# Patient Record
Sex: Male | Born: 1980 | Race: Black or African American | Hispanic: No | Smoking: Current some day smoker
Health system: Southern US, Community
[De-identification: ages and names within clinical notes are randomized; demographics above are authoritative.]

## PROBLEM LIST (undated history)

## (undated) DIAGNOSIS — J45909 Unspecified asthma, uncomplicated: Secondary | ICD-10-CM

---

## 2014-08-07 ENCOUNTER — Emergency Department
Admit: 2014-08-07 | Disposition: A | Discharge status: Home / Self Care | Payer: Self-pay | Admitting: Emergency Medicine

## 2014-08-10 ENCOUNTER — Emergency Department: Admit: 2014-08-10 | Disposition: A | Discharge status: Home / Self Care | Payer: Self-pay | Admitting: Internal Medicine

## 2016-08-27 ENCOUNTER — Emergency Department
Admission: EM | Admit: 2016-08-27 | Discharge: 2016-08-27 | Disposition: A | Discharge status: Home / Self Care | Payer: Self-pay | Attending: Emergency Medicine | Admitting: Emergency Medicine

## 2016-08-27 DIAGNOSIS — F17200 Nicotine dependence, unspecified, uncomplicated: Secondary | ICD-10-CM | POA: Insufficient documentation

## 2016-08-27 DIAGNOSIS — L0231 Cutaneous abscess of buttock: Secondary | ICD-10-CM | POA: Insufficient documentation

## 2016-08-27 MED ORDER — LIDOCAINE HCL (PF) 1 % IJ SOLN
INTRAMUSCULAR | Status: AC
  Filled 2016-08-27: qty 5

## 2016-08-27 MED ORDER — ONDANSETRON 4 MG PO TBDP
4.0000 mg | ORAL_TABLET | Freq: Once | ORAL | Status: AC
  Administered 2016-08-27: 4 mg via ORAL
  Filled 2016-08-27: qty 1

## 2016-08-27 MED ORDER — LIDOCAINE HCL (PF) 1 % IJ SOLN
10.0000 mL | Freq: Once | INTRAMUSCULAR | Status: DC
  Filled 2016-08-27: qty 10

## 2016-08-27 MED ORDER — SULFAMETHOXAZOLE-TRIMETHOPRIM 800-160 MG PO TABS
1.0000 | ORAL_TABLET | Freq: Once | ORAL | Status: AC
  Administered 2016-08-27: 1 via ORAL
  Filled 2016-08-27: qty 1

## 2016-08-27 MED ORDER — HYDROCODONE-ACETAMINOPHEN 5-325 MG PO TABS: 1.0000 | ORAL_TABLET | Freq: Three times a day (TID) | ORAL | 0 refills | Status: DC | PRN

## 2016-08-27 MED ORDER — OXYCODONE-ACETAMINOPHEN 5-325 MG PO TABS
1.0000 | ORAL_TABLET | Freq: Once | ORAL | Status: AC
  Administered 2016-08-27: 1 via ORAL
  Filled 2016-08-27: qty 1

## 2016-08-27 MED ORDER — SULFAMETHOXAZOLE-TRIMETHOPRIM 800-160 MG PO TABS: 1.0000 | ORAL_TABLET | Freq: Two times a day (BID) | ORAL | 0 refills | Status: DC

## 2016-08-27 NOTE — Discharge Instructions (Signed)
Keep the area clean, dry, and covered. Apply warm compresses to promote healing. Take the antibiotic as directed and the pain medicine as needed. Return to the ED in 2-3 days for wound check and packing removal.

## 2016-08-27 NOTE — ED Provider Notes (Signed)
[facility] Emergency Department Provider Note ____________________________________________  Time seen: 1345  I have reviewed the triage vital signs and the nursing notes.  HISTORY  Chief Complaint  Abscess  HPI Daniel Leblanc is a 36 y.o. male presents to the ED for evaluation of a probable abscess to the right buttocks has been present since Friday. He describes the pain became increasingly unbearable after a long 12+ hour car drive over the weekend. He admits to having to use a pillow to sit on. He denies any previous history of abscess or boils. He denies any interim fevers, chills, sweats, or spontaneous drainage.  History reviewed. No pertinent past medical history.  There are no active problems to display for this patient.  History reviewed. No pertinent surgical history.  Prior to Admission medications   Medication Sig Start Date End Date Taking? Authorizing Provider  HYDROcodone-acetaminophen (NORCO) 5-325 MG tablet Take 1 tablet by mouth 3 (three) times daily as needed. 08/27/16   Emiyah Spraggins V Bacon Eretria Manternach, PA-C  sulfamethoxazole-trimethoprim (BACTRIM DS,SEPTRA DS) 800-160 MG tablet Take 1 tablet by mouth 2 (two) times daily. 08/27/16   Charlesetta Ivory Jhett Fretwell, PA-C   Allergies Patient has no allergy information on record.  History reviewed. No pertinent family history.  Social History Social History  Substance Use Topics  . Smoking status: Current Some Day Smoker  . Smokeless tobacco: Not on file  . Alcohol use No   Review of Systems  Constitutional: Negative for fever. Cardiovascular: Negative for chest pain. Respiratory: Negative for shortness of breath. Gastrointestinal: Negative for abdominal pain, vomiting and diarrhea. Skin: Negative for rash. Right buttock abscess.  Neurological: Negative for headaches, focal weakness or numbness. ____________________________________________  PHYSICAL EXAM:  VITAL SIGNS: ED Triage Vitals  Enc  Vitals Group     BP 08/27/16 1223 120/78     Pulse Rate 08/27/16 1223 100     Resp 08/27/16 1223 16     Temp 08/27/16 1223 97.6 F (36.4 C)     Temp Source 08/27/16 1223 Oral     SpO2 08/27/16 1223 96 %     Weight 08/27/16 1224 180 lb (81.6 kg)     Height 08/27/16 1224  (1.753 m)     Head Circumference --      Peak Flow --      Pain Score 08/27/16 1225 6     Pain Loc --      Pain Edu? --      Excl. in GC? --     Constitutional: Alert and oriented. Well appearing and in no distress. Head: Normocephalic and atraumatic. Cardiovascular: Normal rate, regular rhythm. Normal distal pulses. Respiratory: Normal respiratory effort.  Gastrointestinal: Soft and nontender. No distention. Musculoskeletal: Nontender with normal range of motion in all extremities.  Skin:  Skin is warm, dry and intact. No rash noted. Right buttocks with a large area of induration and cellulitis with a small central area of fluctuance within the gluteal cleft. No encroachment upon the sphincter on evaluation. ____________________________________________  PROCEDURES  Bactrim DS I PO Percocet 5-325 mg PO  INCISION AND DRAINAGE Performed by: Lissa Hoard Consent: Verbal consent obtained. Risks and benefits: risks, benefits and alternatives were discussed Type: abscess  Body area: right buttock   Anesthesia: local infiltration  Incision was made with a scalpel.  Local anesthetic: lidocaine 1% w/o epinephrine  Anesthetic total: 10 ml  Complexity: complex Blunt dissection to break up loculations  Drainage: purulent  Drainage amount: minimal  Packing material: 1/4 in iodoform gauze  Patient tolerance: Patient tolerated the procedure well with no immediate complications. ____________________________________________  INITIAL IMPRESSION / ASSESSMENT AND PLAN / ED COURSE  Patient with a right buttock abscess s/p incision and drainage. He is discharged with prescription for Norco and  Bactrim DS pain and infection, respectively. He will return to the ED in 2-3 days for packing removal.   ____________________________________________  FINAL CLINICAL IMPRESSION(S) / ED DIAGNOSES  Final diagnoses:  Abscess of buttock, right      Lissa Hoard, PA-C 08/27/16 1855    Nita Sickle, MD 08/28/16 [phone]

## 2016-08-27 NOTE — ED Triage Notes (Signed)
abcess to R buttock that pt states has grown since Friday/Saturday when first noticed. States painful to sit. States near buttcrack and goes toward thigh. Alert, oriented, ambulatory.

## 2016-08-30 ENCOUNTER — Emergency Department
Admission: EM | Admit: 2016-08-30 | Discharge: 2016-08-30 | Disposition: A | Discharge status: Home / Self Care | Payer: Self-pay | Attending: Emergency Medicine | Admitting: Emergency Medicine

## 2016-08-30 ENCOUNTER — Encounter: Payer: Self-pay | Admitting: Medical Oncology

## 2016-08-30 DIAGNOSIS — Z4800 Encounter for change or removal of nonsurgical wound dressing: Secondary | ICD-10-CM | POA: Insufficient documentation

## 2016-08-30 DIAGNOSIS — F17200 Nicotine dependence, unspecified, uncomplicated: Secondary | ICD-10-CM | POA: Insufficient documentation

## 2016-08-30 DIAGNOSIS — Z5189 Encounter for other specified aftercare: Secondary | ICD-10-CM

## 2016-08-30 NOTE — ED Triage Notes (Signed)
Here for recheck of wound on buttocks and packing removal.

## 2016-08-30 NOTE — ED Provider Notes (Signed)
[facility] Emergency Department Provider Note   ____________________________________________   First MD Initiated Contact with Patient 08/30/16 1012     (approximate)  I have reviewed the triage vital signs and the nursing notes.   HISTORY  Chief Complaint Wound Check    HPI Daniel Leblanc is a 36 y.o. male here for packing removal of an abscess that was opened3 days ago in the emergency room. He denies any continued problems. Currently rates his pain is 2/10.   History reviewed. No pertinent past medical history.  There are no active problems to display for this patient.   History reviewed. No pertinent surgical history.  Prior to Admission medications   Medication Sig Start Date End Date Taking? Authorizing Provider  HYDROcodone-acetaminophen (NORCO) 5-325 MG tablet Take 1 tablet by mouth 3 (three) times daily as needed. 08/27/16   Jenise V Bacon Menshew, PA-C  sulfamethoxazole-trimethoprim (BACTRIM DS,SEPTRA DS) 800-160 MG tablet Take 1 tablet by mouth 2 (two) times daily. 08/27/16   Jenise V Bacon Menshew, PA-C    Allergies Shellfish allergy  No family history on file.  Social History Social History  Substance Use Topics  . Smoking status: Current Some Day Smoker  . Smokeless tobacco: Not on file  . Alcohol use No    Review of Systems  Constitutional: No fever/chills Cardiovascular: Denies chest pain. Respiratory: Denies shortness of breath. Gastrointestinal: .  No nausea, no vomiting.  Musculoskeletal: Negative for back pain. Skin: Positive resolving abscess right buttocks. Neurological: Negative for headaches   ____________________________________________   PHYSICAL EXAM:  VITAL SIGNS: ED Triage Vitals  Enc Vitals Group     BP 08/30/16 0859 118/81     Pulse Rate 08/30/16 0859 84     Resp 08/30/16 0859 16     Temp 08/30/16 0859 98.5 F (36.9 C)     Temp Source 08/30/16 0859 Oral     SpO2 08/30/16 0859 98 %   Weight 08/30/16 0856 180 lb (81.6 kg)     Height 08/30/16 0856  (1.753 m)     Head Circumference --      Peak Flow --      Pain Score 08/30/16 0856 2     Pain Loc --      Pain Edu? --      Excl. in GC? --     Constitutional: Alert and oriented. Well appearing and in no acute distress. Eyes: Conjunctivae are normal. PERRL. EOMI. Head: Atraumatic. Neck: No stridor.   Cardiovascular: Normal rate, regular rhythm. Grossly normal heart sounds.  Good peripheral circulation. Respiratory: Normal respiratory effort.  No retractions. Lungs CTAB. Musculoskeletal: Moves upper and lower extremities without any difficulty. Normal gait was noted. Neurologic:  Normal speech and language. No gross focal neurologic deficits are appreciated. No gait instability. Skin:  Skin is warm, dry.  Right buttocks medial aspect healing abscess without extending erythema. Psychiatric: Mood and affect are normal. Speech and behavior are normal.  ____________________________________________   LABS (all labs ordered are listed, but only abnormal results are displayed)  Labs Reviewed - No data to display  PROCEDURES  Procedure(s) performed: packing removal  Procedures  Critical Care performed: No  ____________________________________________   INITIAL IMPRESSION / ASSESSMENT AND PLAN / ED COURSE  Pertinent labs & imaging results that were available during my care of the patient were reviewed by me and considered in my medical decision making (see chart for details).  Packing was removed without any complications. Dressing was placed and  patient is to continue taking antibiotics until completely finished. He'll follow-up with [facility] clinic if any continued problems.      ____________________________________________   FINAL CLINICAL IMPRESSION(S) / ED DIAGNOSES  Final diagnoses:  Wound check, abscess      NEW MEDICATIONS STARTED DURING THIS VISIT:  Discharge Medication List as of  08/30/2016 10:28 AM       Note:  This document was prepared using Dragon voice recognition software and may include unintentional dictation errors.    Tommi Rumps, PA-C 08/30/16 1148    Myrna Blazer, MD 08/30/16 1630

## 2016-08-30 NOTE — ED Notes (Signed)
See triage note  States he had area lanced couple of days ago  Here for packing removal

## 2016-08-30 NOTE — Discharge Instructions (Signed)
Continue warm soaks or soak in a tub of warm water. Continue your medications. Follow-up with [facility] clinic acute-care if any continued problems. Take ibuprofen or Tylenol while at work. Continue to change dressings as needed.

## 2017-06-22 ENCOUNTER — Emergency Department
Admission: EM | Admit: 2017-06-22 | Discharge: 2017-06-22 | Disposition: A | Discharge status: Home / Self Care | Payer: BLUE CROSS/BLUE SHIELD | Attending: Emergency Medicine | Admitting: Emergency Medicine

## 2017-06-22 ENCOUNTER — Other Ambulatory Visit: Payer: Self-pay

## 2017-06-22 DIAGNOSIS — M25561 Pain in right knee: Secondary | ICD-10-CM | POA: Insufficient documentation

## 2017-06-22 DIAGNOSIS — Z79899 Other long term (current) drug therapy: Secondary | ICD-10-CM | POA: Diagnosis not present

## 2017-06-22 DIAGNOSIS — F17200 Nicotine dependence, unspecified, uncomplicated: Secondary | ICD-10-CM | POA: Insufficient documentation

## 2017-06-22 MED ORDER — KETOROLAC TROMETHAMINE 30 MG/ML IJ SOLN: 30.0000 mg | Freq: Once | INTRAMUSCULAR | Status: DC

## 2017-06-22 MED ORDER — KETOROLAC TROMETHAMINE 10 MG PO TABS: 10.0000 mg | ORAL_TABLET | Freq: Four times a day (QID) | ORAL | 0 refills | Status: DC | PRN

## 2017-06-22 MED ORDER — KETOROLAC TROMETHAMINE 30 MG/ML IJ SOLN
30.0000 mg | Freq: Once | INTRAMUSCULAR | Status: AC
  Administered 2017-06-22: 30 mg via INTRAMUSCULAR
  Filled 2017-06-22: qty 1

## 2017-06-22 NOTE — ED Notes (Signed)

## 2017-06-22 NOTE — ED Provider Notes (Signed)
[facility] Emergency Department Provider Note  ____________________________________________  Time seen: Approximately 1:24 PM  I have reviewed the triage vital signs and the nursing notes.   HISTORY  Chief Complaint Knee Pain    HPI Daniel Leblanc is a 37 y.o. male presents emergency department for evaluation of right knee pain for 2 days.  It feels like it is "in the joint." He states that it is not painful to touch but it is painful when he bears weight on it.  No swelling.  No trauma.  Patient states that he was in the [employer] and has had knee pain in the past from this.  No trauma.  No alleviating measures have been attempted.  No redness, warmth, numbness, tingling.    History reviewed. No pertinent past medical history.  There are no active problems to display for this patient.   History reviewed. No pertinent surgical history.  Prior to Admission medications   Medication Sig Start Date End Date Taking? Authorizing Provider  HYDROcodone-acetaminophen (NORCO) 5-325 MG tablet Take 1 tablet by mouth 3 (three) times daily as needed. 08/27/16   Menshew, Charlesetta Ivory, PA-C  ketorolac (TORADOL) 10 MG tablet Take 1 tablet (10 mg total) by mouth every 6 (six) hours as needed. 06/22/17   Enid Derry, PA-C  sulfamethoxazole-trimethoprim (BACTRIM DS,SEPTRA DS) 800-160 MG tablet Take 1 tablet by mouth 2 (two) times daily. 08/27/16   Menshew, Charlesetta Ivory, PA-C    Allergies Shellfish allergy  History reviewed. No pertinent family history.  Social History Social History   Tobacco Use  . Smoking status: Current Some Day Smoker  Substance Use Topics  . Alcohol use: No  . Drug use: Not on file     Review of Systems  Constitutional: No fever/chills Cardiovascular: No chest pain. Respiratory: No SOB. Gastrointestinal: No abdominal pain.  No nausea, no vomiting.   Musculoskeletal: Positive for knee pain. Skin: Negative for rash, abrasions,  lacerations, ecchymosis.   ____________________________________________   PHYSICAL EXAM:  VITAL SIGNS: ED Triage Vitals [06/22/17 1121]  Enc Vitals Group     BP 115/65     Pulse Rate (!) 108     Resp 18     Temp 98.3 F (36.8 C)     Temp Source Oral     SpO2 100 %     Weight 160 lb (72.6 kg)     Height 5\' 9"  (1.753 m)     Head Circumference      Peak Flow      Pain Score 5     Pain Loc      Pain Edu?      Excl. in GC?      Constitutional: Alert and oriented. Well appearing and in no acute distress. Eyes: Conjunctivae are normal. PERRL. EOMI. Head: Atraumatic. ENT:      Ears:      Nose: No congestion/rhinnorhea.      Mouth/Throat: Mucous membranes are moist.  Neck: No stridor.   Cardiovascular: Normal rate, regular rhythm.  Good peripheral circulation. Respiratory: Normal respiratory effort without tachypnea or retractions. Lungs CTAB. Good air entry to the bases with no decreased or absent breath sounds. Musculoskeletal: Full range of motion to all extremities. No gross deformities appreciated.  No warmth or erythema to knee.  No pain with range of motion of knee.  No tenderness to palpation. No effusion noted. Negative anterior drawer, posterior drawer, valgus, varus, mcMurray, patella apprehension. Positive apley grind.  Strength equal in lower  extremity's bilaterally. Neurologic:  Normal speech and language. No gross focal neurologic deficits are appreciated.  Skin:  Skin is warm, dry and intact. No rash noted.   ____________________________________________   LABS (all labs ordered are listed, but only abnormal results are displayed)  Labs Reviewed - No data to display ____________________________________________  EKG   ____________________________________________  RADIOLOGY   No results found.  ____________________________________________    PROCEDURES  Procedure(s) performed:    Procedures    Medications  ketorolac (TORADOL) 30 MG/ML  injection 30 mg (30 mg Intramuscular Given 06/22/17 1401)     ____________________________________________   INITIAL IMPRESSION / ASSESSMENT AND PLAN / ED COURSE  Pertinent labs & imaging results that were available during my care of the patient were reviewed by me and considered in my medical decision making (see chart for details).  Review of the [place] CSRS was performed in accordance of the [facility] prior to dispensing any controlled drugs.   Patient presented to the emergency department for evaluation of left knee pain for 2 days.  Vital signs and exam are reassuring.  We discussed a knee x-ray and patient is agreeable that this will unlikely show anything.  Patient would like to try conservative measures first.  Knee was ace wrapped and crutches were given.  IM Toradol was given.  Patient will be discharged home with prescriptions for Toradol. Patient is to follow up with PCP as directed. Patient is given ED precautions to return to the ED for any worsening or new symptoms.   ____________________________________________  FINAL CLINICAL IMPRESSION(S) / ED DIAGNOSES  Final diagnoses:  Acute pain of right knee      NEW MEDICATIONS STARTED DURING THIS VISIT:  ED Discharge Orders        Ordered    ketorolac (TORADOL) 10 MG tablet  Every 6 hours PRN     06/22/17 1346          This chart was dictated using voice recognition software/Dragon. Despite best efforts to proofread, errors can occur which can change the meaning. Any change was purely unintentional.    Enid DerryWagner, Traevon Meiring, PA-C 06/22/17 1543    Arnaldo NatalMalinda, Paul F, MD 06/23/17 [phone]

## 2017-06-22 NOTE — ED Triage Notes (Signed)
Pt arrives POV to ED c/o R knee pain. Denies injury or trauma. Pain x few days. Alert, oriented, ambulatory. hasn't taken anything at home.

## 2017-06-22 NOTE — ED Notes (Signed)
Pt reports that he is having R knee pain, but denies injury to R knee at this time.  Pt states that this started a few days ago.  R knee is slightly more swollen.

## 2017-07-07 ENCOUNTER — Encounter: Payer: Self-pay | Admitting: Emergency Medicine

## 2017-07-07 ENCOUNTER — Emergency Department
Admission: EM | Admit: 2017-07-07 | Discharge: 2017-07-07 | Disposition: A | Discharge status: Home / Self Care | Payer: BLUE CROSS/BLUE SHIELD | Attending: Emergency Medicine | Admitting: Emergency Medicine

## 2017-07-07 ENCOUNTER — Emergency Department: Payer: BLUE CROSS/BLUE SHIELD

## 2017-07-07 DIAGNOSIS — M25561 Pain in right knee: Secondary | ICD-10-CM | POA: Diagnosis not present

## 2017-07-07 DIAGNOSIS — F17200 Nicotine dependence, unspecified, uncomplicated: Secondary | ICD-10-CM | POA: Diagnosis not present

## 2017-07-07 DIAGNOSIS — Z79899 Other long term (current) drug therapy: Secondary | ICD-10-CM | POA: Diagnosis not present

## 2017-07-07 MED ORDER — MELOXICAM 15 MG PO TABS: 15.0000 mg | ORAL_TABLET | Freq: Every day | ORAL | 0 refills | Status: DC

## 2017-07-07 NOTE — ED Notes (Signed)
See triage note  Noticed pain and swelling to right knee w/o injury    States this has happened in the past

## 2017-07-07 NOTE — Discharge Instructions (Signed)
Please avoid any squatting or twisting on the right knee.  Take Mobic 15 mg daily with food.  Rest ice and elevate the knee.  Follow-up with orthopedics if no improvement 1 week.

## 2017-07-07 NOTE — ED Triage Notes (Signed)
Pt comes into the ED via POV c/o right knee pain and swelling.  Patient ambulatory to triage and denies any injury to the knee.

## 2017-07-07 NOTE — ED Provider Notes (Signed)
[facility] EMERGENCY DEPARTMENT Provider Note   CSN: [id number] Arrival date & time: 07/07/17  1247     History   Chief Complaint Chief Complaint  Patient presents with  . Knee Pain    HPI Daniel Leblanc is a 37 y.o. male presents to the emergency department for evaluation of localized knee pain to the right knee.  Pain is been present for several weeks.  No trauma or injury.  He denies any catching clicking or locking but does have intermittent swelling throughout the right knee.  Patient states he was in [employer] and did a lot of jumping in and out of vehicles.  He describes a aching throbbing pain along the medial joint line with no warmth redness or fevers.  No pain throughout the calf.  No edema throughout the lower leg.  He was prescribed Toradol p.o. last visit on 06/22/2017 and did not get this filled.  HPI  History reviewed. No pertinent past medical history.  There are no active problems to display for this patient.   History reviewed. No pertinent surgical history.     Home Medications    Prior to Admission medications   Medication Sig Start Date End Date Taking? Authorizing Provider  HYDROcodone-acetaminophen (NORCO) 5-325 MG tablet Take 1 tablet by mouth 3 (three) times daily as needed. 08/27/16   Menshew, Charlesetta Ivory, PA-C  meloxicam (MOBIC) 15 MG tablet Take 1 tablet (15 mg total) by mouth daily. 07/07/17 07/07/18  Evon Slack, PA-C  sulfamethoxazole-trimethoprim (BACTRIM DS,SEPTRA DS) 800-160 MG tablet Take 1 tablet by mouth 2 (two) times daily. 08/27/16   Menshew, Charlesetta Ivory, PA-C    Family History No family history on file.  Social History Social History   Tobacco Use  . Smoking status: Current Some Day Smoker  . Smokeless tobacco: Never Used  Substance Use Topics  . Alcohol use: No  . Drug use: Not on file     Allergies   Shellfish allergy   Review of Systems Review of Systems  Constitutional: Negative for  fever.  Musculoskeletal: Positive for arthralgias, gait problem and joint swelling. Negative for back pain and neck pain.  Skin: Negative for wound.  Neurological: Negative for numbness.     Physical Exam Updated Vital Signs BP 121/70 (BP Location: Left Arm)   Pulse 85   Temp 98.4 F (36.9 C) (Oral)   Resp 18   Ht 5\' 9"  (1.753 m)   Wt 72.6 kg (160 lb)   SpO2 97%   BMI 23.63 kg/m   Physical Exam  Constitutional: He is oriented to person, place, and time. He appears well-developed and well-nourished.  HENT:  Head: Normocephalic and atraumatic.  Eyes: Conjunctivae are normal.  Neck: Normal range of motion.  Cardiovascular: Normal rate.  Pulmonary/Chest: Effort normal. No respiratory distress.  Musculoskeletal:  Examination of the right lower extremity shows patient has full range of motion of the hip with no discomfort.  He has no swelling warmth erythema or edema throughout the right lower extremity.  Knee shows no signs of any effusion.  He has full range of motion of the knee with mild pain with hyperflexion.  Knee is stable to valgus and varus stress testing.  He is mildly tender along the medial joint line and nontender along the lateral joint line.  Patella tracks well.  No edema throughout the right leg.  Negative Homans sign bilaterally.  He is able to actively straight leg raise.  Neurological: He  is alert and oriented to person, place, and time.  Skin: Skin is warm. No rash noted.  Psychiatric: He has a normal mood and affect. His behavior is normal. Thought content normal.     ED Treatments / Results  Labs (all labs ordered are listed, but only abnormal results are displayed) Labs Reviewed - No data to display  EKG  EKG Interpretation None       Radiology Dg Knee Complete 4 Views Right  Result Date: 07/07/2017 CLINICAL DATA:  Patient reports diffuse swelling and pain to medial surface of right knee x2 weeks. Reports pain is worse when weight bearing and  during flexion or extension. Patient denies any recent injury. No previous injury to right knee. EXAM: RIGHT KNEE - COMPLETE 4+ VIEW COMPARISON:  None. FINDINGS: No evidence of fracture, dislocation, or joint effusion. No evidence of arthropathy or other focal bone abnormality. Soft tissues are unremarkable. IMPRESSION: Negative. Electronically Signed   By: Amie Portlandavid  Ormond M.D.   On: 07/07/2017 13:53    Procedures Procedures (including critical care time)  Medications Ordered in ED Medications - No data to display   Initial Impression / Assessment and Plan / ED Course  I have reviewed the triage vital signs and the nursing notes.  Pertinent labs & imaging results that were available during my care of the patient were reviewed by me and considered in my medical decision making (see chart for details).    37 year old male with right knee pain along the medial joint line.  Pain is localized.  No signs of any effusion, septic joint or DVT.  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  Recommend patient start oral anti-inflammatory medication Mobic.  Patient will follow up with orthopedics if no improvement in 1 week. Final Clinical Impressions(s) / ED Diagnoses   Final diagnoses:  Acute pain of right knee    ED Discharge Orders        Ordered    meloxicam (MOBIC) 15 MG tablet  Daily     07/07/17 1441       Ronnette JuniperGaines, Inari Shin C, PA-C 07/07/17 1445    Governor RooksLord, Rebecca, MD 07/07/17 1520

## 2017-07-25 DIAGNOSIS — S86919A Strain of unspecified muscle(s) and tendon(s) at lower leg level, unspecified leg, initial encounter: Secondary | ICD-10-CM | POA: Insufficient documentation

## 2018-02-17 ENCOUNTER — Other Ambulatory Visit: Payer: Self-pay

## 2018-02-17 ENCOUNTER — Emergency Department
Admission: EM | Admit: 2018-02-17 | Discharge: 2018-02-17 | Disposition: A | Discharge status: Home / Self Care | Payer: BLUE CROSS/BLUE SHIELD | Attending: Emergency Medicine | Admitting: Emergency Medicine

## 2018-02-17 ENCOUNTER — Encounter: Payer: Self-pay | Admitting: Emergency Medicine

## 2018-02-17 DIAGNOSIS — F17200 Nicotine dependence, unspecified, uncomplicated: Secondary | ICD-10-CM | POA: Diagnosis not present

## 2018-02-17 DIAGNOSIS — Y999 Unspecified external cause status: Secondary | ICD-10-CM | POA: Insufficient documentation

## 2018-02-17 DIAGNOSIS — Y929 Unspecified place or not applicable: Secondary | ICD-10-CM | POA: Insufficient documentation

## 2018-02-17 DIAGNOSIS — S0501XA Injury of conjunctiva and corneal abrasion without foreign body, right eye, initial encounter: Secondary | ICD-10-CM

## 2018-02-17 DIAGNOSIS — Y939 Activity, unspecified: Secondary | ICD-10-CM | POA: Insufficient documentation

## 2018-02-17 DIAGNOSIS — S0993XA Unspecified injury of face, initial encounter: Secondary | ICD-10-CM | POA: Diagnosis present

## 2018-02-17 DIAGNOSIS — W228XXA Striking against or struck by other objects, initial encounter: Secondary | ICD-10-CM | POA: Diagnosis not present

## 2018-02-17 MED ORDER — TETRACAINE HCL 0.5 % OP SOLN
2.0000 [drp] | Freq: Once | OPHTHALMIC | Status: AC
  Administered 2018-02-17: 2 [drp] via OPHTHALMIC
  Filled 2018-02-17: qty 4

## 2018-02-17 MED ORDER — FLUORESCEIN SODIUM 1 MG OP STRP
1.0000 | ORAL_STRIP | Freq: Once | OPHTHALMIC | Status: AC
  Administered 2018-02-17: 1 via OPHTHALMIC
  Filled 2018-02-17: qty 1

## 2018-02-17 MED ORDER — TOBRAMYCIN 0.3 % OP SOLN: 2.0000 [drp] | OPHTHALMIC | 0 refills | Status: DC

## 2018-02-17 MED ORDER — EYE WASH OPHTH SOLN
1.0000 [drp] | OPHTHALMIC | Status: DC | PRN
  Filled 2018-02-17: qty 118

## 2018-02-17 MED ORDER — DICLOFENAC SODIUM 0.1 % OP SOLN: 2.0000 [drp] | Freq: Four times a day (QID) | OPHTHALMIC | 0 refills | Status: DC
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
## 2018-02-17 NOTE — ED Provider Notes (Signed)
[facility] Emergency Department Provider Note  ____________________________________________   None    (approximate)  I have reviewed the triage vital signs and the nursing notes.   HISTORY  Chief Complaint Eye Pain    HPI Daniel Leblanc is a 37 y.o. male presents emergency department complaining of right eye pain.  He states that the tag on his shirt hit him in the eye yesterday.  He denies any visual changes but states that the light hurts his eyes at this time.  He states his eye has been red and watery.  He denies any fever or chills.    History reviewed. No pertinent past medical history.  There are no active problems to display for this patient.   History reviewed. No pertinent surgical history.  Prior to Admission medications   Medication Sig Start Date End Date Taking? Authorizing Provider  diclofenac (VOLTAREN) 0.1 % ophthalmic solution Place 2 drops into the right eye 4 (four) times daily. 02/17/18   Fisher, Roselyn Bering, PA-C  tobramycin (TOBREX) 0.3 % ophthalmic solution Place 2 drops into the right eye every 4 (four) hours. 02/17/18   Faythe Ghee, PA-C    Allergies Shellfish allergy  History reviewed. No pertinent family history.  Social History Social History   Tobacco Use  . Smoking status: Current Some Day Smoker  . Smokeless tobacco: Never Used  Substance Use Topics  . Alcohol use: No  . Drug use: Not on file    Review of Systems  Constitutional: No fever/chills Eyes: No visual changes.  Positive for right eye pain ENT: No sore throat. Respiratory: Denies cough Genitourinary: Negative for dysuria. Musculoskeletal: Negative for back pain. Skin: Negative for rash.    ____________________________________________   PHYSICAL EXAM:  VITAL SIGNS: ED Triage Vitals [02/17/18 1407]  Enc Vitals Group     BP (!) 110/59     Pulse Rate 80     Resp 20     Temp 98.4 F (36.9 C)     Temp Source Oral     SpO2 100 %       Weight 175 lb (79.4 kg)     Height 5\' 9"  (1.753 m)     Head Circumference      Peak Flow      Pain Score 6     Pain Loc      Pain Edu?      Excl. in GC?     Constitutional: Alert and oriented. Well appearing and in no acute distress. Eyes: Tetracaine inserted,fluorescein stain shows small abrasion, no other dye uptake, perrl, eomi Head: Atraumatic. Nose: No congestion/rhinnorhea. Mouth/Throat: Mucous membranes are moist.   Neck:  supple no lymphadenopathy noted Cardiovascular: Normal rate, regular rhythm.  Respiratory: Normal respiratory effort.  No retractions,  GU: deferred Musculoskeletal: FROM all extremities, warm and well perfused Neurologic:  Normal speech and language.  Skin:  Skin is warm, dry and intact. No rash noted. Psychiatric: Mood and affect are normal. Speech and behavior are normal.  ____________________________________________   LABS (all labs ordered are listed, but only abnormal results are displayed)  Labs Reviewed - No data to display ____________________________________________   ____________________________________________  RADIOLOGY    ____________________________________________   PROCEDURES  Procedure(s) performed: No  Procedures    ____________________________________________   INITIAL IMPRESSION / ASSESSMENT AND PLAN / ED COURSE  Pertinent labs & imaging results that were available during my care of the patient were reviewed by me and considered in my medical decision  making (see chart for details).   Patient is a 37 year old male presents emergency department complaint of right eye pain  On physical exam there is a corneal abrasion noted on the right eye.  Explained the findings to the patient.  He was given a prescription for tobramycin ophthalmic drops along with Voltaren ophthalmic drops.  If he is worsening he is to follow-up with [facility] return to the emergency department.  If he is not better in 2 to 3  days he should see [facility].  Patient was given instructions on corneal abrasions.  He is to return if needed.  He was discharged in stable condition.     As part of my medical decision making, I reviewed the following data within the electronic MEDICAL RECORD NUMBER History obtained from family, Nursing notes reviewed and incorporated, Notes from prior ED visits and [place] Controlled Substance Database  ____________________________________________   FINAL CLINICAL IMPRESSION(S) / ED DIAGNOSES  Final diagnoses:  Abrasion of right cornea, initial encounter      NEW MEDICATIONS STARTED DURING THIS VISIT:  New Prescriptions   DICLOFENAC (VOLTAREN) 0.1 % OPHTHALMIC SOLUTION    Place 2 drops into the right eye 4 (four) times daily.   TOBRAMYCIN (TOBREX) 0.3 % OPHTHALMIC SOLUTION    Place 2 drops into the right eye every 4 (four) hours.     Note:  This document was prepared using Dragon voice recognition software and may include unintentional dictation errors.    Faythe Ghee, PA-C 02/17/18 1616    Dionne Bucy, MD 02/17/18 2252

## 2018-02-17 NOTE — Discharge Instructions (Addendum)
Follow-up with [facility] if not better in 2 days.  Use medications as prescribed.  Return emergency department if you are worsening.

## 2018-02-17 NOTE — ED Triage Notes (Signed)
Pt reports that yesterday a "Clothing Tack" hit him in his right eye. Pt reports that light hurts his eye, it is red and watery.

## 2018-05-18 ENCOUNTER — Other Ambulatory Visit: Payer: Self-pay

## 2018-05-18 DIAGNOSIS — Z5321 Procedure and treatment not carried out due to patient leaving prior to being seen by health care provider: Secondary | ICD-10-CM | POA: Insufficient documentation

## 2018-05-18 DIAGNOSIS — K0889 Other specified disorders of teeth and supporting structures: Secondary | ICD-10-CM | POA: Diagnosis present

## 2018-05-18 NOTE — ED Triage Notes (Signed)
Pt was seen by the dentist a couple days ago and placed on an antibiotic, states that tonight his pain has become severe, states that he was told of infection in the tooth and the need for a possible extraction, pt has some swelling noted to the right side of his face, pt had left over hydrocodone that he took around 2200, pt also states that he took ibuprofen pta as well

## 2018-05-19 ENCOUNTER — Emergency Department
Admission: EM | Admit: 2018-05-19 | Discharge: 2018-05-19 | Discharge status: Against Medical Advice | Payer: BLUE CROSS/BLUE SHIELD | Attending: Emergency Medicine | Admitting: Emergency Medicine

## 2018-07-24 ENCOUNTER — Emergency Department: Payer: BLUE CROSS/BLUE SHIELD

## 2018-07-24 ENCOUNTER — Encounter: Payer: Self-pay | Admitting: Emergency Medicine

## 2018-07-24 ENCOUNTER — Other Ambulatory Visit: Payer: Self-pay

## 2018-07-24 DIAGNOSIS — J988 Other specified respiratory disorders: Secondary | ICD-10-CM | POA: Insufficient documentation

## 2018-07-24 DIAGNOSIS — B9789 Other viral agents as the cause of diseases classified elsewhere: Secondary | ICD-10-CM | POA: Diagnosis not present

## 2018-07-24 DIAGNOSIS — R05 Cough: Secondary | ICD-10-CM | POA: Diagnosis not present

## 2018-07-24 DIAGNOSIS — R0602 Shortness of breath: Secondary | ICD-10-CM | POA: Diagnosis present

## 2018-07-24 DIAGNOSIS — F17210 Nicotine dependence, cigarettes, uncomplicated: Secondary | ICD-10-CM | POA: Insufficient documentation

## 2018-07-24 DIAGNOSIS — R0789 Other chest pain: Secondary | ICD-10-CM | POA: Insufficient documentation

## 2018-07-24 MED ORDER — SODIUM CHLORIDE 0.9% FLUSH: 3.0000 mL | Freq: Once | INTRAVENOUS | Status: DC

## 2018-07-24 NOTE — ED Triage Notes (Signed)
Pt ambulatory to triage with steady gait, no distress noted.Pt reports he had childhood asthma, remembers the feeling of tightness in his chest and throat area, today symptoms were the same. Pt also reports having a "heavy" chest. Pt denies N/V.

## 2018-07-25 ENCOUNTER — Emergency Department
Admission: EM | Admit: 2018-07-25 | Discharge: 2018-07-25 | Disposition: A | Discharge status: Home / Self Care | Payer: BLUE CROSS/BLUE SHIELD | Attending: Emergency Medicine | Admitting: Emergency Medicine

## 2018-07-25 DIAGNOSIS — B9789 Other viral agents as the cause of diseases classified elsewhere: Secondary | ICD-10-CM

## 2018-07-25 DIAGNOSIS — R0789 Other chest pain: Secondary | ICD-10-CM

## 2018-07-25 DIAGNOSIS — J988 Other specified respiratory disorders: Secondary | ICD-10-CM

## 2018-07-25 DIAGNOSIS — R059 Cough, unspecified: Secondary | ICD-10-CM

## 2018-07-25 DIAGNOSIS — R05 Cough: Secondary | ICD-10-CM

## 2018-07-25 DIAGNOSIS — R0602 Shortness of breath: Secondary | ICD-10-CM

## 2018-07-25 HISTORY — DX: Unspecified asthma, uncomplicated: J45.909

## 2018-07-25 LAB — CBC
HEMATOCRIT: 44.4 % (ref 39.0–52.0)
HEMOGLOBIN: 14.7 g/dL (ref 13.0–17.0)
MCH: 32 pg (ref 26.0–34.0)
MCHC: 33.1 g/dL (ref 30.0–36.0)
MCV: 96.7 fL (ref 80.0–100.0)
Platelets: 226 10*3/uL (ref 150–400)
RBC: 4.59 MIL/uL (ref 4.22–5.81)
RDW: 12.5 % (ref 11.5–15.5)
WBC: 11.2 10*3/uL — ABNORMAL HIGH (ref 4.0–10.5)
nRBC: 0 % (ref 0.0–0.2)

## 2018-07-25 LAB — BASIC METABOLIC PANEL
ANION GAP: 9 (ref 5–15)
BUN: 13 mg/dL (ref 6–20)
CO2: 26 mmol/L (ref 22–32)
Calcium: 9.2 mg/dL (ref 8.9–10.3)
Chloride: 105 mmol/L (ref 98–111)
Creatinine, Ser: 0.95 mg/dL (ref 0.61–1.24)
GFR calc Af Amer: >60 mL/min (ref >60)
GFR calc non Af Amer: >60 mL/min (ref >60)
GLUCOSE: 106 mg/dL — AB (ref 70–99)
POTASSIUM: 4 mmol/L (ref 3.5–5.1)
Sodium: 140 mmol/L (ref 135–145)

## 2018-07-25 LAB — TROPONIN I: Troponin I: <0.03 ng/mL (ref <0.03)

## 2018-07-25 NOTE — ED Provider Notes (Signed)
[facility] Emergency Department Provider Note  ____________________________________________   First MD Initiated Contact with Patient 07/25/18 0320     (approximate)  I have reviewed the triage vital signs and the nursing notes.   HISTORY  Chief Complaint Shortness of Breath    HPI Daniel Leblanc is a 38 y.o. male with a history that includes mild and generally well-controlled asthma as well as tobacco use up until a month ago.  He presents for evaluation of  several days of some tightness in his chest, cough, sore throat, and some general malaise.  Given the current COVID-19 scare he was concerned.  He does not have any contacts with anyone who is tested positive nor who have been traveling to high prevalence areas, but he works as a Nature conservation officer at [employer].  He denies fever/chills, nausea, vomiting, and abdominal pain.  He states that the symptoms have been mild to moderate and the shortness of breath was little bit worse today.  He states it feels like when he used to have asthma exacerbations as a child.  No one around him has been ill recently of which she is aware.  Nothing particular makes his symptoms better and exertion seems to make shortness of breath a little bit worse.          Past Medical History:  Diagnosis Date  . Asthma     There are no active problems to display for this patient.   History reviewed. No pertinent surgical history.  Prior to Admission medications   Medication Sig Start Date End Date Taking? Authorizing Provider  diclofenac (VOLTAREN) 0.1 % ophthalmic solution Place 2 drops into the right eye 4 (four) times daily. 02/17/18   Fisher, Roselyn Bering, PA-C  tobramycin (TOBREX) 0.3 % ophthalmic solution Place 2 drops into the right eye every 4 (four) hours. 02/17/18   Faythe Ghee, PA-C    Allergies Shellfish allergy  History reviewed. No pertinent family history.  Social History Social History   Tobacco Use  .  Smoking status: Current Some Day Smoker  . Smokeless tobacco: Never Used  Substance Use Topics  . Alcohol use: No  . Drug use: Not on file    Review of Systems Constitutional: No fever/chills Eyes: No visual changes. ENT: +sore throat.  Nasal congestion and runny nose. Cardiovascular: Some tightness in his chest but no pain. Respiratory: Shortness of breath and cough Gastrointestinal: No abdominal pain.  No nausea, no vomiting.  No diarrhea.  No constipation. Genitourinary: Negative for dysuria. Musculoskeletal: Negative for neck pain.  Negative for back pain. Integumentary: Negative for rash. Neurological: Negative for headaches, focal weakness or numbness.   ____________________________________________   PHYSICAL EXAM:  VITAL SIGNS: ED Triage Vitals  Enc Vitals Group     BP 07/24/18 2351 125/87     Pulse Rate 07/24/18 2351 88     Resp 07/24/18 2351 18     Temp 07/24/18 2351 98.4 F (36.9 C)     Temp Source 07/24/18 2351 Oral     SpO2 07/24/18 2351 100 %     Weight 07/24/18 2348 74.8 kg (165 lb)     Height 07/24/18 2348 1.753 m (5\' 9" )     Head Circumference --      Peak Flow --      Pain Score --      Pain Loc --      Pain Edu? --      Excl. in GC? --  Constitutional: Alert and oriented. Well appearing and in no acute distress. Eyes: Conjunctivae are normal.  Head: Atraumatic. Nose: Mild congestion/rhinnorhea. Mouth/Throat: Mucous membranes are moist.  Oropharynx non-erythematous.  No evidence of throat or oropharyngeal infection/abscess. Neck: No stridor.  No meningeal signs.   Cardiovascular: Normal rate, regular rhythm. Good peripheral circulation. Grossly normal heart sounds. Respiratory: Normal respiratory effort.  No retractions. Lungs CTAB. Gastrointestinal: Soft and nontender. No distention.  Musculoskeletal: No lower extremity tenderness nor edema. No gross deformities of extremities. Neurologic:  Normal speech and language. No gross focal  neurologic deficits are appreciated.  Skin:  Skin is warm, dry and intact. No rash noted. Psychiatric: Mood and affect are normal. Speech and behavior are normal.  ____________________________________________   LABS (all labs ordered are listed, but only abnormal results are displayed)  Labs Reviewed  BASIC METABOLIC PANEL - Abnormal; Notable for the following components:      Result Value   Glucose, Bld 106 (*)    All other components within normal limits  CBC - Abnormal; Notable for the following components:   WBC 11.2 (*)    All other components within normal limits  TROPONIN I   ____________________________________________  EKG  ED ECG REPORT I, Loleta Rose, the attending physician, personally viewed and interpreted this ECG.  Date: 07/24/2018 EKG Time: 23: 49 Rate: 87 Rhythm: normal sinus rhythm QRS Axis: normal Intervals: normal ST/T Wave abnormalities: normal Narrative Interpretation: no evidence of acute ischemia  ____________________________________________  RADIOLOGY I, Loleta Rose, personally viewed and evaluated these images (plain radiographs) as part of my medical decision making, as well as reviewing the written report by the radiologist.  ED MD interpretation: No indication of acute intrathoracic abnormality or infection  Official radiology report(s): Dg Chest 2 View  Result Date: 07/25/2018 CLINICAL DATA:  Shortness of breath EXAM: CHEST - 2 VIEW COMPARISON:  None. FINDINGS: Heart and mediastinal contours are within normal limits. No focal opacities or effusions. No acute bony abnormality. IMPRESSION: No active cardiopulmonary disease. Electronically Signed   By: Charlett Nose M.D.   On: 07/25/2018 00:03    ____________________________________________   PROCEDURES   Procedure(s) performed (including Critical Care):  Procedures   ____________________________________________   INITIAL IMPRESSION / MDM / ASSESSMENT AND PLAN / ED COURSE   As part of my medical decision making, I reviewed the following data within the electronic MEDICAL RECORD NUMBER History obtained from family, Nursing notes reviewed and incorporated, Labs reviewed , EKG interpreted , Old chart reviewed, Radiograph reviewed  and Notes from prior ED visits         Differential diagnosis includes, but is not limited to, allergies, asthma, nonspecific viral infection, pneumonia, much less likely COVID-19, ACS, PE.  The patient is well-appearing and in no distress.  He is PERC negative.  Vital signs are stable and he is afebrile.  Although he works at [employer] he has not had direct contact with anyone who has tested positive for COVID-19 and he lives in an area with a prevalence close to zero as far as we know at this point.  He does not meet current criteria for testing.  I explained all this to him but also provide information about where he can go to [facility] or [place] for drive up testing evaluation.  He has no sign of acute or emergent medical condition at this time and I explained the reassuring results including his lab work, chest x-ray, EKG.  I recommended he try taking over-the-counter allergy  medicine, Tylenol, and follow-up with his doctor.  I gave strict return precautions and he understands and agrees with the plan.     ____________________________________________  FINAL CLINICAL IMPRESSION(S) / ED DIAGNOSES  Final diagnoses:  SOB (shortness of breath)  Cough  Chest tightness  Viral respiratory infection     MEDICATIONS GIVEN DURING THIS VISIT:  Medications - No data to display   ED Discharge Orders    None       Note:  This document was prepared using Dragon voice recognition software and may include unintentional dictation errors.   Loleta Rose, MD 07/25/18 [id number]

## 2018-07-25 NOTE — Discharge Instructions (Addendum)
As we discussed, we believe your symptoms are caused by a respiratory virus (a cold), or perhaps some seasonal allergies.  Your medical workup and evaluation was reassuring here in the Emergency Department and there is no indication for you to stay in the hospital.  You can follow up as an outpatient and stay your regular medications as well as any new medications prescribed tonight.  Please remember to drink plenty of fluids. ° °We currently do not have the capacity to test everyone who comes to the emergency department for the virus that causes COVID-19 or to do drive up or drive-through testing.  However, there is a drive up testing center in [place] at [address].  [facility] is also doing a walk-up respiratory infection clinic and testing.  Once you arrive to the main [facility] campus in [place] (near [address]), do not go to the emergency department (unless you are having an emergency), but look around for the signs directing people to the respiratory infection testing center.  We cannot guarantee that they will test you, but these are two options available to you if you wish to pursue COVID-19 testing. ° °If you have other worsening signs or symptoms that concern you, please return to your nearest emergency department. °

## 2018-10-13 ENCOUNTER — Ambulatory Visit (INDEPENDENT_AMBULATORY_CARE_PROVIDER_SITE_OTHER): Payer: BC Managed Care – PPO | Admitting: Podiatry

## 2018-10-13 ENCOUNTER — Ambulatory Visit (INDEPENDENT_AMBULATORY_CARE_PROVIDER_SITE_OTHER): Payer: BC Managed Care – PPO

## 2018-10-13 ENCOUNTER — Other Ambulatory Visit: Payer: Self-pay

## 2018-10-13 VITALS — Temp 97.7°F

## 2018-10-13 DIAGNOSIS — M79671 Pain in right foot: Secondary | ICD-10-CM

## 2018-10-13 DIAGNOSIS — L989 Disorder of the skin and subcutaneous tissue, unspecified: Secondary | ICD-10-CM

## 2018-10-13 DIAGNOSIS — M2041 Other hammer toe(s) (acquired), right foot: Secondary | ICD-10-CM | POA: Diagnosis not present

## 2018-10-20 NOTE — Progress Notes (Signed)
   HPI: 38 year old male presenting today as a new patient with a chief complaint of a painful lesion between the 4th and 5th digits of the right foot that appeared a few weeks ago. He was seen by Dr. Gershon Mussel and had the area drained of fluid/purulence. There are no modifying factors noted. Patient is here for further evaluation and treatment.   Past Medical History:  Diagnosis Date  . Asthma      Physical Exam: General: The patient is alert and oriented x3 in no acute distress.  Dermatology: Hyperkeratotic tissue noted to the 4th interdigital webspace of the right foot. Skin is warm, dry and supple bilateral lower extremities. Negative for open lesions or macerations.  Vascular: Palpable pedal pulses bilaterally. No edema or erythema noted. Capillary refill within normal limits.  Neurological: Epicritic and protective threshold grossly intact bilaterally.   Musculoskeletal Exam: Range of motion within normal limits to all pedal and ankle joints bilateral. Muscle strength 5/5 in all groups bilateral.   Assessment: 1. Heloma molle right 4th interspace    Plan of Care:  1. Patient evaluated.  2. Excisional debridement of keratotic lesion using a chisel blade was performed without incident. Light dressing applied.  3. Recommended wide fitting shoes.  4. Return to clinic as needed.      Edrick Kins, DPM [facility]  Dr. Edrick Kins, DPM    [address], [place] [address]                Office [phone]  Fax [phone]

## 2018-10-22 ENCOUNTER — Ambulatory Visit (INDEPENDENT_AMBULATORY_CARE_PROVIDER_SITE_OTHER): Payer: BC Managed Care – PPO | Admitting: Podiatry

## 2018-10-22 ENCOUNTER — Encounter: Payer: Self-pay | Admitting: Podiatry

## 2018-10-22 ENCOUNTER — Other Ambulatory Visit: Payer: Self-pay

## 2018-10-22 ENCOUNTER — Telehealth: Payer: Self-pay | Admitting: *Deleted

## 2018-10-22 VITALS — Temp 98.1°F

## 2018-10-22 DIAGNOSIS — L03115 Cellulitis of right lower limb: Secondary | ICD-10-CM | POA: Diagnosis not present

## 2018-10-22 DIAGNOSIS — L84 Corns and callosities: Secondary | ICD-10-CM | POA: Diagnosis not present

## 2018-10-22 MED ORDER — DOXYCYCLINE HYCLATE 100 MG PO TABS
100.0000 mg | ORAL_TABLET | Freq: Two times a day (BID) | ORAL | 0 refills | Status: DC
Start: 2018-10-22 — End: 2018-11-14

## 2018-10-22 NOTE — Telephone Encounter (Signed)
Pt's finance, Ria Comment states pt is having severe pain in between his toes and was not able to sleep. Dr. Amalia Hailey states have pt come in today at 3:15pm

## 2018-10-26 NOTE — Progress Notes (Signed)
   HPI: 38 year old male presenting today for follow up evaluation of a Heloma molle of the 4th interspace of the right foot. He states he was doing well until last night when he started experiencing severe pain and swelling. Touching the area increases the pain. He has not done anything for treatment. Patient is here for further evaluation and treatment.   Past Medical History:  Diagnosis Date  . Asthma      Physical Exam: General: The patient is alert and oriented x3 in no acute distress.  Dermatology: Hyperkeratotic tissue noted to the 4th interdigital webspace of the right foot. Erythema and edema noted to the right lateral forefoot. Skin is warm, dry and supple bilateral lower extremities. Negative for open lesions or macerations.  Vascular: Palpable pedal pulses bilaterally. Capillary refill within normal limits.  Neurological: Epicritic and protective threshold grossly intact bilaterally.   Musculoskeletal Exam: Range of motion within normal limits to all pedal and ankle joints bilateral. Muscle strength 5/5 in all groups bilateral.   Assessment: 1. Heloma molle right 4th interspace  2. Cellulitis right lateral forefoot    Plan of Care:  1. Patient evaluated.  2. Prescription for Doxycycline 100 mg #20 provided to patient.  3. Post op shoe dispensed.  4. Recommended Betadine between the toes daily.  5. Return to clinic in two weeks.   [employer] at [employer].      Edrick Kins, DPM [facility]  Dr. Edrick Kins, DPM    [address], [place] [address]                Office [phone]  Fax [phone]

## 2018-11-03 ENCOUNTER — Other Ambulatory Visit: Payer: Self-pay

## 2018-11-03 ENCOUNTER — Ambulatory Visit (INDEPENDENT_AMBULATORY_CARE_PROVIDER_SITE_OTHER): Payer: BC Managed Care – PPO | Admitting: Podiatry

## 2018-11-03 VITALS — Temp 97.9°F

## 2018-11-03 DIAGNOSIS — L03115 Cellulitis of right lower limb: Secondary | ICD-10-CM | POA: Diagnosis not present

## 2018-11-03 DIAGNOSIS — L84 Corns and callosities: Secondary | ICD-10-CM | POA: Diagnosis not present

## 2018-11-03 DIAGNOSIS — L989 Disorder of the skin and subcutaneous tissue, unspecified: Secondary | ICD-10-CM

## 2018-11-05 NOTE — Progress Notes (Signed)
   HPI: 38 year old male presenting today for follow up evaluation of a Heloma molle of the 4th interspace of the right foot. He reports a thick discharge from the area yesterday that has since resolved. He states the area is feeling better since the drainage. He reports finishing the Doxycycline as directed. There are no modifying factors noted. Patient is here for further evaluation and treatment.   Past Medical History:  Diagnosis Date  . Asthma      Physical Exam: General: The patient is alert and oriented x3 in no acute distress.  Dermatology: Hyperkeratotic tissue noted to the 4th interdigital webspace of the right foot. Skin is warm, dry and supple bilateral lower extremities. Negative for open lesions or macerations.  Vascular: Palpable pedal pulses bilaterally. Capillary refill within normal limits.  Neurological: Epicritic and protective threshold grossly intact bilaterally.   Musculoskeletal Exam: Range of motion within normal limits to all pedal and ankle joints bilateral. Muscle strength 5/5 in all groups bilateral.   Assessment: 1. Heloma molle right 4th interspace  2. Cellulitis right lateral forefoot - resolved    Plan of Care:  1. Patient evaluated.  2. Debridement of hyperkeratotic tissue performed with tissue nipper.  3. Recommended Betadine between the toes daily.  4. Return to clinic as needed.   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       Edrick Kins, DPM [facility]  Dr. Edrick Kins, DPM    [address], [place] [address]                Office [phone]  Fax [phone]

## 2018-11-14 ENCOUNTER — Other Ambulatory Visit: Payer: Self-pay | Admitting: Podiatry

## 2018-11-14 ENCOUNTER — Telehealth: Payer: Self-pay | Admitting: Podiatry

## 2018-11-14 MED ORDER — DOXYCYCLINE HYCLATE 100 MG PO TABS: 100.0000 mg | ORAL_TABLET | Freq: Two times a day (BID) | ORAL | 0 refills | Status: DC

## 2018-11-14 NOTE — Telephone Encounter (Signed)
Pt was previously seen for cellulitis and is beginning to have issues again. Pt is scheduled for an appt on Monday 7/13 but wanted to know if he should start taking antibiotics before then. Please give patient a call.

## 2018-11-14 NOTE — Progress Notes (Signed)
PRN cellulitis  

## 2018-11-14 NOTE — Telephone Encounter (Signed)
Patient has been notified of antibiotic and he can pick up at pharmacy this afternoon

## 2018-11-14 NOTE — Telephone Encounter (Signed)
Rx doxycycline sent to pharmacy. - Dr. Evans

## 2018-11-17 ENCOUNTER — Ambulatory Visit (INDEPENDENT_AMBULATORY_CARE_PROVIDER_SITE_OTHER): Payer: BC Managed Care – PPO | Admitting: Podiatry

## 2018-11-17 ENCOUNTER — Other Ambulatory Visit: Payer: Self-pay

## 2018-11-17 ENCOUNTER — Encounter: Payer: Self-pay | Admitting: Podiatry

## 2018-11-17 ENCOUNTER — Telehealth: Payer: Self-pay | Admitting: *Deleted

## 2018-11-17 ENCOUNTER — Ambulatory Visit (INDEPENDENT_AMBULATORY_CARE_PROVIDER_SITE_OTHER): Payer: BC Managed Care – PPO

## 2018-11-17 VITALS — Temp 98.4°F

## 2018-11-17 DIAGNOSIS — L03115 Cellulitis of right lower limb: Secondary | ICD-10-CM

## 2018-11-17 DIAGNOSIS — L02611 Cutaneous abscess of right foot: Secondary | ICD-10-CM

## 2018-11-17 NOTE — Telephone Encounter (Signed)
-----   Message from Edrick Kins, DPM sent at 11/17/2018 12:25 PM EDT ----- Regarding: MRI right forefoot w/out contrast Please order MRI RT forefoot w/out contrast.   Dx: soft tissue abscess RT 4th toe.   Thanks, Dr. Amalia Hailey

## 2018-11-17 NOTE — Telephone Encounter (Signed)
Orders to J. Quintana, RN for pre-cert and faxed to [facility]. 

## 2018-11-22 LAB — WOUND CULTURE
MICRO NUMBER:: 669821
SPECIMEN QUALITY:: ADEQUATE

## 2018-11-24 ENCOUNTER — Other Ambulatory Visit: Payer: Self-pay

## 2018-11-24 DIAGNOSIS — L02611 Cutaneous abscess of right foot: Secondary | ICD-10-CM

## 2018-11-24 DIAGNOSIS — L03115 Cellulitis of right lower limb: Secondary | ICD-10-CM

## 2018-11-24 NOTE — Progress Notes (Signed)
   HPI: 38 year old male presenting today for follow up evaluation of a Heloma molle of the 4th interspace of the right foot.  Patient states that since last visit the cellulitis has recurred.  He is now getting some drainage and purulence from the interdigital space of the right foot.  He says the pain is also come back and he is he is back on antibiotics.  He presents for follow-up treatment and evaluation  Past Medical History:  Diagnosis Date  . Asthma      Physical Exam: General: The patient is alert and oriented x3 in no acute distress.  Dermatology: Hyperkeratotic tissue noted to the 4th interdigital webspace of the right foot. Skin is warm, with an open wound and purulence noted to the fourth interdigital space of the right foot.  Periwound integrity is macerated.   Vascular: Palpable pedal pulses bilaterally. Capillary refill within normal limits.  Erythema and edema noted localized to the fourth interdigital space of the right foot  Neurological: Epicritic and protective threshold grossly intact bilaterally.   Musculoskeletal Exam: Range of motion within normal limits to all pedal and ankle joints bilateral. Muscle strength 5/5 in all groups bilateral.   Assessment: 1. Heloma molle right 4th interspace  2. Cellulitis right lateral forefoot -recurrent with purulent drainage   Plan of Care:  1. Patient evaluated.  2.  Today new cultures were taken and sent to pathology for culture and sensitivity 3.  Continue oral doxycycline 100 mg twice daily as prescribed 4.  Today we will order an MRI of the right forefoot to determine if there is any underlying deep tissue abscess 5.  Return to clinic in 3 weeks  [place] at [employer] in [place]. 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       Edrick Kins, DPM [facility]  Dr. Edrick Kins, DPM    [address], [place] [address]                 Office [phone]  Fax [phone]

## 2018-12-08 ENCOUNTER — Other Ambulatory Visit: Payer: Self-pay

## 2018-12-08 ENCOUNTER — Ambulatory Visit
Admission: RE | Admit: 2018-12-08 | Discharge: 2018-12-08 | Disposition: A | Discharge status: Home / Self Care | Payer: BLUE CROSS/BLUE SHIELD | Source: Ambulatory Visit | Attending: Podiatry | Admitting: Podiatry

## 2018-12-08 DIAGNOSIS — L02611 Cutaneous abscess of right foot: Secondary | ICD-10-CM

## 2018-12-08 DIAGNOSIS — L03115 Cellulitis of right lower limb: Secondary | ICD-10-CM

## 2018-12-08 MED ORDER — GADOBENATE DIMEGLUMINE 529 MG/ML IV SOLN
15.0000 mL | Freq: Once | INTRAVENOUS | Status: AC | PRN
  Administered 2018-12-08: 15 mL via INTRAVENOUS

## 2018-12-10 ENCOUNTER — Telehealth: Payer: Self-pay | Admitting: Podiatry

## 2018-12-10 ENCOUNTER — Other Ambulatory Visit: Payer: Self-pay | Admitting: Podiatry

## 2018-12-10 MED ORDER — DOXYCYCLINE HYCLATE 100 MG PO TABS: 100.0000 mg | ORAL_TABLET | Freq: Two times a day (BID) | ORAL | 0 refills | Status: DC

## 2018-12-10 NOTE — Progress Notes (Signed)
Dx: Cellulitis right foot with abscess  Spoke with patient this evening on the phone.  MRI was reviewed.  I recommend aggressive surgical incision and drainage of the abscess with debridement and washout.  Patient has been dealing with this foot infection off and on for the past several months now.  Patient states that his last round of doxycycline cleared up the infection however over the last 2 days the swelling and redness and infection has recurred.  Prescription sent in for doxycycline 100 mg #20 twice daily Instructed the patient to come into the office tomorrow or Friday to sign surgical consent forms and set up a date for surgery.  Return to clinic 1 week postop  Edrick Kins, DPM [facility]  Dr. Edrick Kins, DPM    [address], [place] [address]                Office [phone]  Fax [phone]

## 2018-12-10 NOTE — Telephone Encounter (Signed)
Patients sgo called stating she is concerned about the patients foot because he is in severe pain and is having trouble putting pressure on his foot. Pt had an MRI done yesterday and is waiting on the results to find out his next steps. Pt would like to know if there is anything that he can do in the meantime to help with his pain.

## 2018-12-11 ENCOUNTER — Telehealth: Payer: Self-pay | Admitting: Podiatry

## 2018-12-11 NOTE — Telephone Encounter (Signed)
DOS: 12/15/2018 Procedures: Incision and Drainage w/ irrigation Rt, Debridement of abscess Rt.  Member Information Member Number: [id number]  Policy Effective : 67/54/4920 - 05/07/2019  Name: Daniel Leblanc Date of Birth: 1980-05-25   Member Liability Summary      In-Network    Max Per Benefit Period Year-to-Date Remaining  CoInsurance     Deductible  $1,750.00 0.00  Out-Of-Pocket  $5,000.00 2,584.00   Active Coverage  In-Network Individual  [employer] Authorization Required  Not Applicable  10%  Yes  Out-Network Individual  [employer] Authorization Required  Not Applicable  07%  Yes  Not Applicable Individual  [employer]  $1.21  Not Applicable    Per Heather R, no prior authorization required for CPT code 10140 but prior authorization is required for CPT code 11043. Clinicals for prior authorization including form/cover sheet were faxed to [phone].  Call Reference# [id number].

## 2018-12-11 NOTE — Patient Instructions (Signed)
Pre-Operative Instructions  Congratulations, you have decided to take an important step towards improving your quality of life.  You can be assured that the doctors and staff at [facility] will be with you every step of the way.  Here are some important things you should know:  1. Plan to be at the surgery center/hospital at least 1 (one) hour prior to your scheduled time, unless otherwise directed by the surgical center/hospital staff.  You must have a responsible adult accompany you, remain during the surgery and drive you home.  Make sure you have directions to the surgical center/hospital to ensure you arrive on time. 2. If you are having surgery at [facility] or [facility], you will need a copy of your medical history and physical form from your family physician within one month prior to the date of surgery. We will give you a form for your primary physician to complete.  3. We make every effort to accommodate the date you request for surgery.  However, there are times where surgery dates or times have to be moved.  We will contact you as soon as possible if a change in schedule is required.   4. No aspirin/ibuprofen for one week before surgery.  If you are on aspirin, any non-steroidal anti-inflammatory medications (Mobic, Aleve, Ibuprofen) should not be taken seven (7) days prior to your surgery.  You make take Tylenol for pain prior to surgery.  5. Medications - If you are taking daily heart and blood pressure medications, seizure, reflux, allergy, asthma, anxiety, pain or diabetes medications, make sure you notify the surgery center/hospital before the day of surgery so they can tell you which medications you should take or avoid the day of surgery. 6. No food or drink after midnight the night before surgery unless directed otherwise by surgical center/hospital staff. 7. No alcoholic beverages 24-hours prior to surgery.  No smoking 24-hours prior or 24-hours after  surgery. 8. Wear loose pants or shorts. They should be loose enough to fit over bandages, boots, and casts. 9. Don't wear slip-on shoes. Sneakers are preferred. 10. Bring your boot with you to the surgery center/hospital.  Also bring crutches or a walker if your physician has prescribed it for you.  If you do not have this equipment, it will be provided for you after surgery. 11. If you have not been contacted by the surgery center/hospital by the day before your surgery, call to confirm the date and time of your surgery. 12. Leave-time from work may vary depending on the type of surgery you have.  Appropriate arrangements should be made prior to surgery with your employer. 13. Prescriptions will be provided immediately following surgery by your doctor.  Fill these as soon as possible after surgery and take the medication as directed. Pain medications will not be refilled on weekends and must be approved by the doctor. 14. Remove nail polish on the operative foot and avoid getting pedicures prior to surgery. 15. Wash the night before surgery.  The night before surgery wash the foot and leg well with water and the antibacterial soap provided. Be sure to pay special attention to beneath the toenails and in between the toes.  Wash for at least three (3) minutes. Rinse thoroughly with water and dry well with a towel.  Perform this wash unless told not to do so by your physician.  Enclosed: 1 Ice pack (please put in freezer the night before surgery)   1 Hibiclens skin cleaner     Pre-op instructions  If you have any questions regarding the instructions, please do not hesitate to call our office.  [place]: [address], [place], [place] [address] -- [phone]  [place]: [address] [place], [place] [address] -- [phone]  [place]: [address]  [place], [place] [address] -- [phone]  [place]: [address], [address], [place], [place] [address] -- [phone]  Website:  [email or web address] 

## 2018-12-15 ENCOUNTER — Telehealth: Payer: Self-pay | Admitting: Podiatry

## 2018-12-15 ENCOUNTER — Other Ambulatory Visit: Payer: Self-pay | Admitting: Podiatry

## 2018-12-15 ENCOUNTER — Telehealth: Payer: Self-pay | Admitting: *Deleted

## 2018-12-15 DIAGNOSIS — L03115 Cellulitis of right lower limb: Secondary | ICD-10-CM

## 2018-12-15 MED ORDER — OXYCODONE-ACETAMINOPHEN 5-325 MG PO TABS: 1.0000 | ORAL_TABLET | Freq: Four times a day (QID) | ORAL | 0 refills | Status: DC | PRN

## 2018-12-15 NOTE — Progress Notes (Signed)
.  postop

## 2018-12-15 NOTE — Telephone Encounter (Signed)
Pt had surgery today and Rx is not at pharmacy.

## 2018-12-15 NOTE — Telephone Encounter (Signed)
I called [employer] to inquire about the status of an authorization request for Mr. Jeanbaptiste.  I spoke to [place] and she informed me that 11043 required authorization but 10140 did not require authorization.  The [phone] code was authorized.  The authorization number is [id number] and it's valid from 12/15/18 to 06/17/2019.  The reference number for the call is the date and time, [id number].

## 2018-12-15 NOTE — Telephone Encounter (Signed)
Pt states the abscess has ruptured and is cleaning with peroxide and applying neosporin. Pt asked what to do. Pt scheduled to have surgery 12/15/2018.

## 2018-12-16 ENCOUNTER — Other Ambulatory Visit: Payer: Self-pay

## 2018-12-16 ENCOUNTER — Ambulatory Visit: Payer: BC Managed Care – PPO

## 2018-12-16 DIAGNOSIS — L03115 Cellulitis of right lower limb: Secondary | ICD-10-CM

## 2018-12-16 MED ORDER — ONDANSETRON HCL 4 MG PO TABS: 4.0000 mg | ORAL_TABLET | Freq: Three times a day (TID) | ORAL | 0 refills | Status: DC | PRN

## 2018-12-16 NOTE — Addendum Note (Signed)
Addended by: Harriett Sine D on: 12/16/2018 12:24 PM   Modules accepted: Orders

## 2018-12-16 NOTE — Telephone Encounter (Signed)
Pt's girlfriend called to say pt is feeling nauseous and has blood in/on the bandage.

## 2018-12-16 NOTE — Telephone Encounter (Signed)
Thank you :)

## 2018-12-16 NOTE — Telephone Encounter (Signed)
I spoke with Ms Edwina Barth and she states pt is nauseous and has blood and a wet spot to the dressing. I told Ms Edwina Barth I would get Dr. Amalia Hailey to call in a anti-emetic for the nausea and to take it about 30 minutes before pt's next dose of the pain medication with light meal, and to come in today at 1:30pm to have the dressing evaluated. Ms Edwina Barth states she will have pt here at 1:30pm.

## 2018-12-18 ENCOUNTER — Telehealth: Payer: Self-pay

## 2018-12-18 ENCOUNTER — Encounter: Payer: Self-pay | Admitting: Podiatry

## 2018-12-18 NOTE — Telephone Encounter (Signed)
Called pt post surgery; "little pain here and there but not too bad; no excessive bleeding, bandages still in tact"; confirmed appt for 12/22/2018 at 8:15am but to call office with any questions or concerns if any before then.

## 2018-12-22 ENCOUNTER — Encounter: Payer: Self-pay | Admitting: Podiatry

## 2018-12-22 ENCOUNTER — Ambulatory Visit (INDEPENDENT_AMBULATORY_CARE_PROVIDER_SITE_OTHER): Payer: BC Managed Care – PPO | Admitting: Podiatry

## 2018-12-22 ENCOUNTER — Other Ambulatory Visit: Payer: Self-pay

## 2018-12-22 VITALS — Temp 97.7°F

## 2018-12-22 DIAGNOSIS — L03115 Cellulitis of right lower limb: Secondary | ICD-10-CM

## 2018-12-22 DIAGNOSIS — L02611 Cutaneous abscess of right foot: Secondary | ICD-10-CM

## 2018-12-22 DIAGNOSIS — Z9889 Other specified postprocedural states: Secondary | ICD-10-CM

## 2018-12-22 MED ORDER — DOXYCYCLINE HYCLATE 100 MG PO TABS: 100.0000 mg | ORAL_TABLET | Freq: Two times a day (BID) | ORAL | 0 refills | Status: DC

## 2018-12-24 NOTE — Progress Notes (Signed)
   Subjective:  Patient presents today status post incision and drainage abscess right foot. DOS: 12/15/2018. He states he is doing well overall. He reports some increased soreness that started today. He has taken the Doxycycline and using the post op shoe as directed. There are no worsening factors indicated. Patient is here for further evaluation and treatment.   Past Medical History:  Diagnosis Date  . Asthma      Objective: Physical Exam General: The patient is alert and oriented x3 in no acute distress.  Dermatology: Open incision noted to the 4th interspace with moderate drainage. Skin is cool, dry and supple bilateral lower extremities.   Vascular: Palpable pedal pulses bilaterally. No edema or erythema noted. Capillary refill within normal limits.  Neurological: Epicritic and protective threshold grossly intact bilaterally.   Musculoskeletal Exam: All pedal and ankle joints range of motion within normal limits bilateral. Muscle strength 5/5 in all groups bilateral.    Assessment: 1. s/p incision and drainage abscess right foot. DOS: 12/25/2018   Plan of Care:  1. Patient was evaluated.  2. Packing removed.  3. Recommended dry sterile dressing with iodine ointment daily.  4. Continue using post op shoe.  5. Refill prescription for Doxycycline 100 mg #20 provided to patient.  6. Return to clinic in one week.    Edrick Kins, DPM [facility]  Dr. Edrick Kins, [address]                                        [place], [place] [address]                Office [phone]  Fax [phone]

## 2018-12-29 ENCOUNTER — Other Ambulatory Visit: Payer: Self-pay

## 2018-12-29 ENCOUNTER — Ambulatory Visit (INDEPENDENT_AMBULATORY_CARE_PROVIDER_SITE_OTHER): Payer: BC Managed Care – PPO | Admitting: Podiatry

## 2018-12-29 ENCOUNTER — Encounter: Payer: Self-pay | Admitting: Podiatry

## 2018-12-29 DIAGNOSIS — L02611 Cutaneous abscess of right foot: Secondary | ICD-10-CM

## 2018-12-29 DIAGNOSIS — Z9889 Other specified postprocedural states: Secondary | ICD-10-CM

## 2018-12-29 DIAGNOSIS — L03115 Cellulitis of right lower limb: Secondary | ICD-10-CM

## 2018-12-31 NOTE — Progress Notes (Signed)
   Subjective:  Patient presents today status post incision and drainage abscess right foot. DOS: 12/15/2018. He states he is doing well. He denies any significant pain or modifying factors. He has been using the post op shoe and taking Doxycycline as directed. Patient is here for further evaluation and treatment.   Past Medical History:  Diagnosis Date  . Asthma      Objective: Physical Exam General: The patient is alert and oriented x3 in no acute distress.  Dermatology: Open incision noted to the 4th interspace with moderate drainage. Skin is cool, dry and supple bilateral lower extremities.   Vascular: Palpable pedal pulses bilaterally. No edema or erythema noted. Capillary refill within normal limits.  Neurological: Epicritic and protective threshold grossly intact bilaterally.   Musculoskeletal Exam: All pedal and ankle joints range of motion within normal limits bilateral. Muscle strength 5/5 in all groups bilateral.    Assessment: 1. s/p incision and drainage abscess right foot. DOS: 12/15/2018   Plan of Care:  1. Patient was evaluated.  2. Discontinue using post op shoe.  3. Continue taking oral Doxycycline until finished.  4. Return to work on 01/05/2019.  5. Return to clinic in 2 weeks.    Edrick Kins, DPM [facility]  Dr. Edrick Kins, [place]                                        [place], [place] [address]                Office [phone]  Fax [phone]

## 2019-01-09 NOTE — Progress Notes (Signed)
Patient is here today for postoperative dressing change, recent procedure performed on 12/15/2018, I&D with irrigation debridement of right foot abscess.  He states he is feeling well, denies fever chills nausea vomiting.  Remove soiled bandages, reapplied Betadine wet-to-dry with sterile gauze and compression.  Patient is to keep his current postop appointment with Dr. Amalia Hailey.

## 2019-01-14 ENCOUNTER — Other Ambulatory Visit: Payer: Self-pay

## 2019-01-14 ENCOUNTER — Encounter: Payer: Self-pay | Admitting: Podiatry

## 2019-01-14 ENCOUNTER — Ambulatory Visit (INDEPENDENT_AMBULATORY_CARE_PROVIDER_SITE_OTHER): Payer: Self-pay | Admitting: Podiatry

## 2019-01-14 DIAGNOSIS — Z9889 Other specified postprocedural states: Secondary | ICD-10-CM

## 2019-01-14 DIAGNOSIS — L03115 Cellulitis of right lower limb: Secondary | ICD-10-CM

## 2019-01-17 NOTE — Progress Notes (Signed)
   Subjective:  Patient presents today status post incision and drainage abscess right foot. DOS: 12/15/2018. He states he is doing well. He denies any pain, redness or drainage. He denies modifying factors. He has completed the course of Doxycycline as directed. Patient is here for further evaluation and treatment.   Past Medical History:  Diagnosis Date  . Asthma      Objective: Physical Exam General: The patient is alert and oriented x3 in no acute distress.  Dermatology: Skin is cool, dry and supple bilateral lower extremities. Negative for open lesions or macerations.  Vascular: Palpable pedal pulses bilaterally. No edema or erythema noted. Capillary refill within normal limits.  Neurological: Epicritic and protective threshold grossly intact bilaterally.   Musculoskeletal Exam: All pedal and ankle joints range of motion within normal limits bilateral. Muscle strength 5/5 in all groups bilateral.    Assessment: 1. s/p incision and drainage abscess right foot. DOS: 12/15/2018 - healed    Plan of Care:  1. Patient was evaluated.  2. Recommended good shoe gear.  3. May resume full activity with no restrictions.  4. Return to clinic as needed.    Edrick Kins, DPM [facility]  Dr. Edrick Kins, [place]                                        [place], [place] [address]                Office [phone]  Fax [phone]

## 2019-06-18 ENCOUNTER — Emergency Department
Admission: EM | Admit: 2019-06-18 | Discharge: 2019-06-18 | Disposition: A | Discharge status: Home / Self Care | Payer: BC Managed Care – PPO | Attending: Emergency Medicine | Admitting: Emergency Medicine

## 2019-06-18 ENCOUNTER — Other Ambulatory Visit: Payer: Self-pay

## 2019-06-18 ENCOUNTER — Emergency Department: Payer: BC Managed Care – PPO

## 2019-06-18 DIAGNOSIS — M5412 Radiculopathy, cervical region: Secondary | ICD-10-CM | POA: Insufficient documentation

## 2019-06-18 DIAGNOSIS — S161XXA Strain of muscle, fascia and tendon at neck level, initial encounter: Secondary | ICD-10-CM | POA: Diagnosis not present

## 2019-06-18 DIAGNOSIS — Y92410 Unspecified street and highway as the place of occurrence of the external cause: Secondary | ICD-10-CM | POA: Diagnosis not present

## 2019-06-18 DIAGNOSIS — Y999 Unspecified external cause status: Secondary | ICD-10-CM | POA: Insufficient documentation

## 2019-06-18 DIAGNOSIS — Y93I9 Activity, other involving external motion: Secondary | ICD-10-CM | POA: Diagnosis not present

## 2019-06-18 DIAGNOSIS — J45909 Unspecified asthma, uncomplicated: Secondary | ICD-10-CM | POA: Diagnosis not present

## 2019-06-18 DIAGNOSIS — Z79899 Other long term (current) drug therapy: Secondary | ICD-10-CM | POA: Insufficient documentation

## 2019-06-18 DIAGNOSIS — S199XXA Unspecified injury of neck, initial encounter: Secondary | ICD-10-CM | POA: Diagnosis present

## 2019-06-18 DIAGNOSIS — F17210 Nicotine dependence, cigarettes, uncomplicated: Secondary | ICD-10-CM | POA: Diagnosis not present

## 2019-06-18 MED ORDER — BACLOFEN 10 MG PO TABS: 10.0000 mg | ORAL_TABLET | Freq: Every day | ORAL | 1 refills | Status: AC

## 2019-06-18 MED ORDER — MELOXICAM 15 MG PO TABS: 15.0000 mg | ORAL_TABLET | Freq: Every day | ORAL | 2 refills | Status: AC
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
## 2019-06-18 NOTE — Discharge Instructions (Addendum)
Follow-up with your regular doctor or Dr. Rosita Kea if not improving in 5 to 7 days.  Take medication as prescribed.  Return to the emergency department if worsening.  May apply ice to the back of the neck for the next 3 days.  Then you can use wet heat followed by ice in this area.
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
## 2019-11-25 ENCOUNTER — Encounter: Payer: Self-pay | Admitting: Podiatry

## 2019-11-25 ENCOUNTER — Other Ambulatory Visit: Payer: Self-pay | Admitting: Podiatry

## 2019-11-25 ENCOUNTER — Ambulatory Visit (INDEPENDENT_AMBULATORY_CARE_PROVIDER_SITE_OTHER): Payer: BC Managed Care – PPO

## 2019-11-25 ENCOUNTER — Ambulatory Visit (INDEPENDENT_AMBULATORY_CARE_PROVIDER_SITE_OTHER): Payer: BC Managed Care – PPO | Admitting: Podiatry

## 2019-11-25 ENCOUNTER — Other Ambulatory Visit: Payer: Self-pay

## 2019-11-25 DIAGNOSIS — S92501A Displaced unspecified fracture of right lesser toe(s), initial encounter for closed fracture: Secondary | ICD-10-CM | POA: Diagnosis not present

## 2019-11-25 DIAGNOSIS — M79671 Pain in right foot: Secondary | ICD-10-CM | POA: Diagnosis not present

## 2019-11-25 NOTE — Progress Notes (Signed)
  Subjective:  Patient ID: Daniel Leblanc, male    DOB: 1980-07-28,  MRN: [id number]  Chief Complaint  Patient presents with  . Toe Injury    Right 5th toe pain 6 days duration "I hit it while playing with my kids", pt states felt a pop 2 days ago. Pt states it is painful to walk.    39 y.o. male presents with the above complaint. History confirmed with patient. Stubbed his right foot against a wall placing with kids. Works as a Forensic psychologist.  Objective:  Physical Exam: warm, good capillary refill, no trophic changes or ulcerative lesions, normal DP and PT pulses and normal sensory exam. Left Foot: normal exam, no swelling, tenderness, instability; ligaments intact, full range of motion of all ankle/foot joints  Right Foot: tenderness to palpation of the 5th digit, edema, no dislocation  No images are attached to the encounter.  Radiographs: X-ray of the right foot: compared to radiographs dated 11/17/18, he previously had biphalangeal 5th toe, he has fractured the distal phalanx since that time Assessment:   1. Closed fracture of phalanx of right fifth toe, initial encounter   2. Pain in right foot      Plan:  Patient was evaluated and treated and all questions answered.   -XR Reviewed with patient,  -Would benefit from trial of non-operative management for this injury., -Toe buddy splinting performed with [place] and showed him how to do this  -WBAT in surgical shoe -Surgical shoe dispensed  No follow-ups on file.   Sharl Ma, DPM 11/25/2019
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
